# Patient Record
Sex: Female | Born: 1990 | Race: White | Hispanic: No | Marital: Single | Smoking: Never smoker
Health system: Southern US, Community
[De-identification: ages and names within clinical notes are randomized; demographics above are authoritative.]

## PROBLEM LIST (undated history)

## (undated) DIAGNOSIS — R51 Headache: Secondary | ICD-10-CM

## (undated) HISTORY — PX: TONSILLECTOMY: SUR1361

---
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
## 2012-05-18 ENCOUNTER — Encounter (HOSPITAL_COMMUNITY): Payer: Self-pay | Admitting: *Deleted

## 2012-05-18 ENCOUNTER — Emergency Department (HOSPITAL_COMMUNITY): Payer: 59

## 2012-05-18 ENCOUNTER — Emergency Department (HOSPITAL_COMMUNITY)
Admission: EM | Admit: 2012-05-18 | Discharge: 2012-05-18 | Disposition: A | Discharge status: Home / Self Care | Payer: 59 | Attending: Emergency Medicine | Admitting: Emergency Medicine

## 2012-05-18 DIAGNOSIS — R109 Unspecified abdominal pain: Secondary | ICD-10-CM

## 2012-05-18 DIAGNOSIS — Z79899 Other long term (current) drug therapy: Secondary | ICD-10-CM | POA: Insufficient documentation

## 2012-05-18 DIAGNOSIS — R1011 Right upper quadrant pain: Secondary | ICD-10-CM | POA: Insufficient documentation

## 2012-05-18 DIAGNOSIS — R1013 Epigastric pain: Secondary | ICD-10-CM | POA: Insufficient documentation

## 2012-05-18 LAB — URINALYSIS, MICROSCOPIC ONLY
Hgb urine dipstick: NEGATIVE
Nitrite: NEGATIVE
Specific Gravity, Urine: 1.022 (ref 1.005–1.030)
Urobilinogen, UA: 0.2 mg/dL (ref 0.0–1.0)

## 2012-05-18 LAB — CBC WITH DIFFERENTIAL/PLATELET
Basophils Absolute: 0.1 10*3/uL (ref 0.0–0.1)
Basophils Relative: 1 % (ref 0–1)
Eosinophils Absolute: 0.3 10*3/uL (ref 0.0–0.7)
Eosinophils Relative: 5 % (ref 0–5)
HCT: 35.5 % — ABNORMAL LOW (ref 36.0–46.0)
MCH: 24.7 pg — ABNORMAL LOW (ref 26.0–34.0)
MCHC: 31.5 g/dL (ref 30.0–36.0)
MCV: 78.4 fL (ref 78.0–100.0)
Monocytes Absolute: 0.3 10*3/uL (ref 0.1–1.0)
RDW: 15.4 % (ref 11.5–15.5)

## 2012-05-18 LAB — COMPREHENSIVE METABOLIC PANEL
AST: 16 U/L (ref 0–37)
Albumin: 3.4 g/dL — ABNORMAL LOW (ref 3.5–5.2)
Calcium: 8.8 mg/dL (ref 8.4–10.5)
Creatinine, Ser: 0.68 mg/dL (ref 0.50–1.10)

## 2012-05-18 LAB — POCT PREGNANCY, URINE: Preg Test, Ur: NEGATIVE

## 2012-05-18 LAB — LIPASE, BLOOD: Lipase: 33 U/L (ref 11–59)

## 2012-05-18 MED ORDER — OXYCODONE-ACETAMINOPHEN 5-325 MG PO TABS: 2.0000 | ORAL_TABLET | ORAL | Status: DC | PRN

## 2012-05-18 MED ORDER — FENTANYL CITRATE 0.05 MG/ML IJ SOLN
100.0000 ug | Freq: Once | INTRAMUSCULAR | Status: AC
  Administered 2012-05-18: 100 ug via INTRAVENOUS
  Filled 2012-05-18: qty 2

## 2012-05-18 MED ORDER — HYDROMORPHONE HCL PF 1 MG/ML IJ SOLN
1.0000 mg | Freq: Once | INTRAMUSCULAR | Status: AC
  Administered 2012-05-18: 1 mg via INTRAVENOUS
  Filled 2012-05-18: qty 1

## 2012-05-18 MED ORDER — METOCLOPRAMIDE HCL 10 MG PO TABS: 10.0000 mg | ORAL_TABLET | Freq: Four times a day (QID) | ORAL | Status: DC | PRN

## 2012-05-18 MED ORDER — SODIUM CHLORIDE 0.9 % IV BOLUS (SEPSIS)
1000.0000 mL | Freq: Once | INTRAVENOUS | Status: AC
  Administered 2012-05-18: 1000 mL via INTRAVENOUS

## 2012-05-18 MED ORDER — ONDANSETRON 8 MG PO TBDP: ORAL_TABLET | ORAL | Status: DC

## 2012-05-18 MED ORDER — FAMOTIDINE IN NACL 20-0.9 MG/50ML-% IV SOLN
20.0000 mg | Freq: Once | INTRAVENOUS | Status: AC
  Administered 2012-05-18: 20 mg via INTRAVENOUS
  Filled 2012-05-18: qty 50

## 2012-05-18 MED ORDER — ONDANSETRON HCL 4 MG/2ML IJ SOLN
4.0000 mg | Freq: Once | INTRAMUSCULAR | Status: AC
  Administered 2012-05-18: 4 mg via INTRAVENOUS
  Filled 2012-05-18: qty 2

## 2012-05-18 MED ORDER — SODIUM CHLORIDE 0.9 % IV SOLN: INTRAVENOUS | Status: DC

## 2012-05-18 NOTE — ED Notes (Addendum)
Pt reports upper abdominal pain that started last night, pain now radiating to back. 10/10 pain. Denies n/v/d. Last bowel movement yesterday. Denies trauma or dysuria.

## 2012-05-18 NOTE — ED Notes (Signed)
Discharge instructions reviewed w/ pt., verbalizes understanding. Three prescriptions provided at discharge. 

## 2012-05-18 NOTE — ED Provider Notes (Addendum)
History     CSN: [id number]  Arrival date & time 05/18/12  1013   First MD Initiated Contact with Patient 05/18/12 1024      Chief Complaint  Patient presents with  . Abdominal Pain    (Consider location/radiation/quality/duration/timing/severity/associated sxs/prior treatment) HPI This 21 year old female is gradual onset overnight of constant epigastric and right upper quadrant abdominal pain with nausea with one episode of vomiting on arrival to the ED. She is no diarrhea, dysuria, vaginal bleeding, or vaginal discharge. There is no treatment prior to arrival. Her constant pain is moderately severe. She is no chest pain cough or shortness of breath. She is no back pain. She is no trauma. She has not had pain like this before. History reviewed. No pertinent past medical history.  History reviewed. No pertinent past surgical history.  Family History  Problem Relation Age of Onset  . Cancer Maternal Aunt     breast  . Cancer Maternal Grandmother     breast    History  Substance Use Topics  . Smoking status: Never Smoker   . Smokeless tobacco: Never Used  . Alcohol Use: No    OB History    Grav Para Term Preterm Abortions TAB SAB Ect Mult Living                  Review of Systems 10 Systems reviewed and are negative for acute change except as noted in the HPI. Allergies  Review of patient's allergies indicates no known allergies.  Home Medications   Current Outpatient Rx  Name Route Sig Dispense Refill  . NORETHIN-ETH ESTRADIOL-FE 0.8-25 MG-MCG PO CHEW Oral Chew 1 tablet by mouth daily.    Marland Kitchen METOCLOPRAMIDE HCL 10 MG PO TABS Oral Take 1 tablet (10 mg total) by mouth every 6 (six) hours as needed (nausea/headache). 6 tablet 0  . ONDANSETRON 8 MG PO TBDP  8mg  ODT q4 hours prn nausea 4 tablet 0  . OXYCODONE-ACETAMINOPHEN 5-325 MG PO TABS Oral Take 2 tablets by mouth every 4 (four) hours as needed for pain. 10 tablet 0    BP 98/50  Pulse 74  Temp 97.7 F (36.5 C)  (Oral)  Resp 14  SpO2 100%  Physical Exam  Nursing note and vitals reviewed. Constitutional:       Awake, alert, nontoxic appearance.  HENT:  Head: Atraumatic.  Eyes: Right eye exhibits no discharge. Left eye exhibits no discharge.  Neck: Neck supple.  Cardiovascular: Normal rate and regular rhythm.   No murmur heard. Pulmonary/Chest: Effort normal and breath sounds normal. No respiratory distress. She has no wheezes. She has no rales. She exhibits no tenderness.  Abdominal: Soft. Bowel sounds are normal. She exhibits no distension and no mass. There is tenderness. There is guarding. There is no rebound.       She is moderate epigastric and right upper quadrant tenderness with guarding without rebound or tenderness the remainder of her abdomen. No back tenderness and no CVA tenderness.  Musculoskeletal: She exhibits no tenderness.       Baseline ROM, no obvious new focal weakness.  Neurological: She is alert.       Mental status and motor strength appears baseline for patient and situation.  Skin: No rash noted.  Psychiatric: She has a normal mood and affect.    ED Course  Procedures (including critical care time) Abd SNT at recheck much better, [facility] paged for f/u. Suspect contaminated U/A. D/w [facility] for f/u. Labs Reviewed  URINALYSIS, MICROSCOPIC  ONLY - Abnormal; Notable for the following:    APPearance TURBID (*)     Leukocytes, UA MODERATE (*)     Bacteria, UA MANY (*)     Squamous Epithelial / LPF MANY (*)     All other components within normal limits  CBC WITH DIFFERENTIAL - Abnormal; Notable for the following:    Hemoglobin 11.2 (*)     HCT 35.5 (*)     MCH 24.7 (*)     All other components within normal limits  COMPREHENSIVE METABOLIC PANEL - Abnormal; Notable for the following:    BUN 5 (*)     Albumin 3.4 (*)     Alkaline Phosphatase 36 (*)     Total Bilirubin 0.2 (*)     All other components within normal limits  POCT PREGNANCY, URINE  LIPASE, BLOOD  LAB REPORT  - SCANNED   No results found.   1. Abdominal pain       MDM  Pt stable in ED with no significant deterioration in condition.  Patient / Family / Caregiver informed of clinical course, understand medical decision-making process, and agree with plan.  I doubt any other EMC precluding discharge at this time including, but not necessarily limited to the following:SBI, peritonitis.        Hurman Horn, MD 05/23/12 2026  Hurman Horn, MD 05/23/12 2027

## 2012-05-20 ENCOUNTER — Encounter (HOSPITAL_COMMUNITY): Payer: Self-pay | Admitting: Pharmacy Technician

## 2012-05-20 ENCOUNTER — Encounter (INDEPENDENT_AMBULATORY_CARE_PROVIDER_SITE_OTHER): Payer: Self-pay | Admitting: General Surgery

## 2012-05-20 ENCOUNTER — Ambulatory Visit (INDEPENDENT_AMBULATORY_CARE_PROVIDER_SITE_OTHER): Payer: 59 | Admitting: General Surgery

## 2012-05-20 VITALS — BP 110/62 | HR 76 | Temp 97.2°F | Resp 16 | Ht 64.0 in | Wt 115.8 lb

## 2012-05-20 DIAGNOSIS — K8020 Calculus of gallbladder without cholecystitis without obstruction: Secondary | ICD-10-CM

## 2012-05-20 NOTE — Addendum Note (Signed)
Addended by: Frederik Schmidt on: 05/20/2012 10:18 AM   Modules accepted: Orders

## 2012-05-20 NOTE — Progress Notes (Signed)
Patient ID: Cheryl Novak, female   DOB: 1990-08-04, 21 y.o.   MRN: [id number]  Chief Complaint  Patient presents with  . Pre-op Exam    eval GB w/ sludge    HPI Cheryl Novak is a 21 y.o. female.  Symptomatic gallstones seen in the ED HPI  Very pleasant 21 y.o. Cheryl Novak from [facility], seen in the ED 2 days ago with severe abdominal pain from gallstones.  First attack.  Still having pain.  No vomiting since then.  No jaundice  History reviewed. No pertinent past medical history.  History reviewed. No pertinent past surgical history.  Family History  Problem Relation Age of Onset  . Cancer Maternal Aunt     breast  . Cancer Maternal Grandmother     breast    Social History History  Substance Use Topics  . Smoking status: Never Smoker   . Smokeless tobacco: Never Used  . Alcohol Use: No    No Known Allergies  Current Outpatient Prescriptions  Medication Sig Dispense Refill  . metoCLOPramide (REGLAN) 10 MG tablet Take 1 tablet (10 mg total) by mouth every 6 (six) hours as needed (nausea/headache).  6 tablet  0  . Norethindrone-Ethinyl Estradiol-Fe (GENERESS FE) 0.8-25 MG-MCG tablet Chew 1 tablet by mouth daily.      . ondansetron (ZOFRAN ODT) 8 MG disintegrating tablet 8mg  ODT q4 hours prn nausea  4 tablet  0  . oxyCODONE-acetaminophen (PERCOCET) 5-325 MG per tablet Take 2 tablets by mouth every 4 (four) hours as needed for pain.  10 tablet  0    Review of Systems Review of Systems  Constitutional: Negative for fever and chills.  Gastrointestinal: Positive for nausea, vomiting and abdominal pain.  All other systems reviewed and are negative.    Blood pressure 110/62, pulse 76, temperature 97.2 F (36.2 C), temperature source Temporal, resp. rate 16, height 5\' 4"  (1.626 m), weight 115 lb 12.8 oz (52.527 kg), last menstrual period 04/20/2012.  Physical Exam Physical Exam  Constitutional: She is oriented to person, place, and time. She appears well-developed and  well-nourished.  HENT:  Head: Normocephalic and atraumatic.  Eyes: Conjunctivae normal and EOM are normal. Pupils are equal, round, and reactive to light.  Neck: Normal range of motion. Neck supple.  Cardiovascular: Normal rate, regular rhythm and normal heart sounds.   Pulmonary/Chest: Effort normal and breath sounds normal.  Abdominal: Soft. Bowel sounds are normal. She exhibits no mass. There is tenderness in the right upper quadrant and epigastric area.    Musculoskeletal: Normal range of motion.  Neurological: She is alert and oriented to person, place, and time.  Skin: Skin is warm and dry.  Psychiatric: She has a normal mood and affect.    Data Reviewed Studies form [facility] visit  Assessment    Symptomatic cholelithiasis, biliary colic    Plan    The patient definitely has gallstone related symptoms. She is from [facility], [place] and may want to have the surgery there closer to the time that she has her winter break. Her athletic trainer was with her at this visit. Restrictions are no lifting for 4 weeks after surgery.  Patient can have surgery here and recover back in [facility], or she could have surgery by surgeon in [facility].         Cheryl Novak 05/20/2012, 9:50 AM

## 2012-05-21 ENCOUNTER — Telehealth (INDEPENDENT_AMBULATORY_CARE_PROVIDER_SITE_OTHER): Payer: Self-pay

## 2012-05-21 NOTE — Telephone Encounter (Signed)
Spoke to patient and gave her her appt with Dr Lindie Spruce 06/13/12 @ 2:20.

## 2012-05-28 ENCOUNTER — Encounter (HOSPITAL_COMMUNITY): Payer: Self-pay

## 2012-05-28 ENCOUNTER — Encounter (HOSPITAL_COMMUNITY)
Admission: RE | Admit: 2012-05-28 | Discharge: 2012-05-28 | Disposition: A | Discharge status: Home / Self Care | Payer: 59 | Source: Ambulatory Visit | Attending: General Surgery | Admitting: General Surgery

## 2012-05-28 HISTORY — DX: Headache: R51

## 2012-05-28 LAB — CBC
HCT: 34.2 % — ABNORMAL LOW (ref 36.0–46.0)
RBC: 4.3 MIL/uL (ref 3.87–5.11)
RDW: 15.5 % (ref 11.5–15.5)
WBC: 6.4 10*3/uL (ref 4.0–10.5)

## 2012-05-28 LAB — SURGICAL PCR SCREEN: Staphylococcus aureus: POSITIVE — AB

## 2012-05-28 NOTE — Pre-Procedure Instructions (Addendum)
20 Cheryl Novak  05/28/2012   Your procedure is scheduled on: 05/30/12  Report to [facility] at 1000 AM.  Call this number if you have problems the morning of surgery: [phone]   Remember:   Do not eat food or drink:After Midnight.    Take these medicines the morning of surgery with A SIP OF WATER: reglan, zofran, pain med   Do not wear jewelry, make-up or nail polish.  Do not wear lotions, powders, or perfumes. .  Do not shave 48 hours prior to surgery. Men may shave face and neck.  Do not bring valuables to the hospital.  Contacts, dentures or bridgework may not be worn into surgery.  Leave suitcase in the car. After surgery it may be brought to your room.  For patients admitted to the hospital, checkout time is 11:00 AM the day of discharge.   Patients discharged the day of surgery will not be allowed to drive home.  Name and phone number of your driver:mom [phone] Cheryl Novak  Special Instructions: Shower using CHG 2 nights before surgery and the night before surgery.  If you shower the day of surgery use CHG.  Use special wash - you have one bottle of CHG for all showers.  You should use approximately 1/3 of the bottle for each shower.   Please read over the following fact sheets that you were given: Pain Booklet, Coughing and Deep Breathing, MRSA Information and Surgical Site Infection Prevention

## 2012-05-29 MED ORDER — DEXTROSE 5 % IV SOLN
2.0000 g | INTRAVENOUS | Status: AC
  Administered 2012-05-30: 2 g via INTRAVENOUS
  Filled 2012-05-29: qty 2

## 2012-05-30 ENCOUNTER — Ambulatory Visit (HOSPITAL_COMMUNITY)
Admission: RE | Admit: 2012-05-30 | Discharge: 2012-05-30 | Disposition: A | Discharge status: Home / Self Care | Payer: 59 | Source: Ambulatory Visit | Attending: General Surgery | Admitting: General Surgery

## 2012-05-30 ENCOUNTER — Encounter (HOSPITAL_COMMUNITY): Payer: Self-pay | Admitting: Anesthesiology

## 2012-05-30 ENCOUNTER — Ambulatory Visit (HOSPITAL_COMMUNITY): Payer: 59 | Admitting: Anesthesiology

## 2012-05-30 ENCOUNTER — Encounter (HOSPITAL_COMMUNITY)
Admission: RE | Disposition: A | Discharge status: Home / Self Care | Payer: Self-pay | Source: Ambulatory Visit | Attending: General Surgery

## 2012-05-30 DIAGNOSIS — Z01812 Encounter for preprocedural laboratory examination: Secondary | ICD-10-CM | POA: Insufficient documentation

## 2012-05-30 DIAGNOSIS — K811 Chronic cholecystitis: Secondary | ICD-10-CM

## 2012-05-30 DIAGNOSIS — K8020 Calculus of gallbladder without cholecystitis without obstruction: Secondary | ICD-10-CM | POA: Insufficient documentation

## 2012-05-30 HISTORY — PX: CHOLECYSTECTOMY: SHX55

## 2012-05-30 SURGERY — LAPAROSCOPIC CHOLECYSTECTOMY
Anesthesia: General | Site: Abdomen | Wound class: Clean Contaminated

## 2012-05-30 MED ORDER — LIDOCAINE HCL 4 % MT SOLN
OROMUCOSAL | Status: DC | PRN
  Administered 2012-05-30: 4 mL via TOPICAL

## 2012-05-30 MED ORDER — ONDANSETRON HCL 4 MG/2ML IJ SOLN
4.0000 mg | Freq: Once | INTRAMUSCULAR | Status: AC
  Administered 2012-05-30: 4 mg via INTRAVENOUS

## 2012-05-30 MED ORDER — MIDAZOLAM HCL 5 MG/5ML IJ SOLN
INTRAMUSCULAR | Status: DC | PRN
  Administered 2012-05-30: 2 mg via INTRAVENOUS

## 2012-05-30 MED ORDER — BUPIVACAINE-EPINEPHRINE 0.25% -1:200000 IJ SOLN
INTRAMUSCULAR | Status: DC | PRN
  Administered 2012-05-30: 17 mL

## 2012-05-30 MED ORDER — FENTANYL CITRATE 0.05 MG/ML IJ SOLN
INTRAMUSCULAR | Status: DC | PRN
  Administered 2012-05-30: 100 ug via INTRAVENOUS
  Administered 2012-05-30: 50 ug via INTRAVENOUS

## 2012-05-30 MED ORDER — ROCURONIUM BROMIDE 100 MG/10ML IV SOLN
INTRAVENOUS | Status: DC | PRN
  Administered 2012-05-30: 30 mg via INTRAVENOUS

## 2012-05-30 MED ORDER — HYDROMORPHONE HCL PF 1 MG/ML IJ SOLN
0.2500 mg | INTRAMUSCULAR | Status: DC | PRN
  Administered 2012-05-30 (×2): 0.25 mg via INTRAVENOUS

## 2012-05-30 MED ORDER — LIDOCAINE HCL (CARDIAC) 20 MG/ML IV SOLN
INTRAVENOUS | Status: DC | PRN
  Administered 2012-05-30: 80 mg via INTRAVENOUS

## 2012-05-30 MED ORDER — SODIUM CHLORIDE 0.9 % IR SOLN
Status: DC | PRN
  Administered 2012-05-30: 1000 mL

## 2012-05-30 MED ORDER — ONDANSETRON HCL 4 MG/2ML IJ SOLN
INTRAMUSCULAR | Status: AC
  Filled 2012-05-30: qty 2

## 2012-05-30 MED ORDER — LACTATED RINGERS IV SOLN
INTRAVENOUS | Status: DC | PRN
  Administered 2012-05-30 (×2): via INTRAVENOUS

## 2012-05-30 MED ORDER — LACTATED RINGERS IV SOLN
INTRAVENOUS | Status: DC
  Administered 2012-05-30: 12:00:00 via INTRAVENOUS

## 2012-05-30 MED ORDER — ONDANSETRON HCL 4 MG/2ML IJ SOLN
INTRAMUSCULAR | Status: DC | PRN
  Administered 2012-05-30: 4 mg via INTRAVENOUS

## 2012-05-30 MED ORDER — PROPOFOL 10 MG/ML IV BOLUS
INTRAVENOUS | Status: DC | PRN
  Administered 2012-05-30: 160 mg via INTRAVENOUS

## 2012-05-30 MED ORDER — HYDROMORPHONE HCL PF 1 MG/ML IJ SOLN
INTRAMUSCULAR | Status: AC
  Filled 2012-05-30: qty 1

## 2012-05-30 MED ORDER — DEXAMETHASONE SODIUM PHOSPHATE 4 MG/ML IJ SOLN
INTRAMUSCULAR | Status: DC | PRN
  Administered 2012-05-30: 8 mg via INTRAVENOUS

## 2012-05-30 MED ORDER — GLYCOPYRROLATE 0.2 MG/ML IJ SOLN
INTRAMUSCULAR | Status: DC | PRN
  Administered 2012-05-30: 0.6 mg via INTRAVENOUS

## 2012-05-30 MED ORDER — OXYCODONE-ACETAMINOPHEN 5-325 MG PO TABS: 1.0000 | ORAL_TABLET | ORAL | Status: DC | PRN

## 2012-05-30 MED ORDER — NEOSTIGMINE METHYLSULFATE 1 MG/ML IJ SOLN
INTRAMUSCULAR | Status: DC | PRN
  Administered 2012-05-30: 4 mg via INTRAVENOUS

## 2012-05-30 MED ORDER — PHENYLEPHRINE HCL 10 MG/ML IJ SOLN
INTRAMUSCULAR | Status: DC | PRN
  Administered 2012-05-30: 80 ug via INTRAVENOUS
  Administered 2012-05-30: 40 ug via INTRAVENOUS

## 2012-05-30 MED ORDER — 0.9 % SODIUM CHLORIDE (POUR BTL) OPTIME
TOPICAL | Status: DC | PRN
  Administered 2012-05-30: 1000 mL

## 2012-05-30 MED ORDER — BUPIVACAINE-EPINEPHRINE PF 0.25-1:200000 % IJ SOLN
INTRAMUSCULAR | Status: AC
  Filled 2012-05-30: qty 30

## 2012-05-30 SURGICAL SUPPLY — 45 items
APPLIER CLIP 5 13 M/L LIGAMAX5 (MISCELLANEOUS) ×3
APPLIER CLIP ROT 10 11.4 M/L (STAPLE)
BLADE SURG ROTATE 9660 (MISCELLANEOUS) IMPLANT
CANISTER SUCTION 2500CC (MISCELLANEOUS) ×3 IMPLANT
CHLORAPREP W/TINT 26ML (MISCELLANEOUS) ×3 IMPLANT
CLIP APPLIE 5 13 M/L LIGAMAX5 (MISCELLANEOUS) ×2 IMPLANT
CLIP APPLIE ROT 10 11.4 M/L (STAPLE) IMPLANT
CLOTH BEACON ORANGE TIMEOUT ST (SAFETY) ×3 IMPLANT
COVER MAYO STAND STRL (DRAPES) IMPLANT
COVER SURGICAL LIGHT HANDLE (MISCELLANEOUS) ×3 IMPLANT
DECANTER SPIKE VIAL GLASS SM (MISCELLANEOUS) ×3 IMPLANT
DERMABOND ADVANCED (GAUZE/BANDAGES/DRESSINGS) ×1
DERMABOND ADVANCED .7 DNX12 (GAUZE/BANDAGES/DRESSINGS) ×2 IMPLANT
DRAPE C-ARM 42X72 X-RAY (DRAPES) IMPLANT
DRAPE UTILITY 15X26 W/TAPE STR (DRAPE) ×6 IMPLANT
ELECT REM PT RETURN 9FT ADLT (ELECTROSURGICAL) ×3
ELECTRODE REM PT RTRN 9FT ADLT (ELECTROSURGICAL) ×2 IMPLANT
GLOVE BIO SURGEON STRL SZ7 (GLOVE) ×3 IMPLANT
GLOVE BIOGEL PI IND STRL 7.0 (GLOVE) ×2 IMPLANT
GLOVE BIOGEL PI IND STRL 7.5 (GLOVE) ×2 IMPLANT
GLOVE BIOGEL PI IND STRL 8 (GLOVE) ×2 IMPLANT
GLOVE BIOGEL PI INDICATOR 7.0 (GLOVE) ×1
GLOVE BIOGEL PI INDICATOR 7.5 (GLOVE) ×1
GLOVE BIOGEL PI INDICATOR 8 (GLOVE) ×1
GLOVE ECLIPSE 7.5 STRL STRAW (GLOVE) ×6 IMPLANT
GLOVE SURG SS PI 7.0 STRL IVOR (GLOVE) ×3 IMPLANT
GOWN STRL NON-REIN LRG LVL3 (GOWN DISPOSABLE) ×9 IMPLANT
KIT BASIN OR (CUSTOM PROCEDURE TRAY) ×3 IMPLANT
KIT ROOM TURNOVER OR (KITS) ×3 IMPLANT
NS IRRIG 1000ML POUR BTL (IV SOLUTION) ×3 IMPLANT
PAD ARMBOARD 7.5X6 YLW CONV (MISCELLANEOUS) ×3 IMPLANT
POUCH SPECIMEN RETRIEVAL 10MM (ENDOMECHANICALS) ×3 IMPLANT
SCISSORS LAP 5X35 DISP (ENDOMECHANICALS) IMPLANT
SET CHOLANGIOGRAPH 5 50 .035 (SET/KITS/TRAYS/PACK) IMPLANT
SET IRRIG TUBING LAPAROSCOPIC (IRRIGATION / IRRIGATOR) ×3 IMPLANT
SLEEVE ENDOPATH XCEL 5M (ENDOMECHANICALS) ×6 IMPLANT
SPECIMEN JAR SMALL (MISCELLANEOUS) ×3 IMPLANT
SUT MNCRL AB 4-0 PS2 18 (SUTURE) ×3 IMPLANT
TOWEL OR 17X24 6PK STRL BLUE (TOWEL DISPOSABLE) ×3 IMPLANT
TOWEL OR 17X26 10 PK STRL BLUE (TOWEL DISPOSABLE) ×3 IMPLANT
TRAY LAPAROSCOPIC (CUSTOM PROCEDURE TRAY) ×3 IMPLANT
TROCAR XCEL BLUNT TIP 100MML (ENDOMECHANICALS) ×3 IMPLANT
TROCAR XCEL NON-BLD 11X100MML (ENDOMECHANICALS) IMPLANT
TROCAR XCEL NON-BLD 5MMX100MML (ENDOMECHANICALS) ×3 IMPLANT
WATER STERILE IRR 1000ML POUR (IV SOLUTION) IMPLANT

## 2012-05-30 NOTE — Anesthesia Preprocedure Evaluation (Addendum)
Anesthesia Evaluation  Patient identified by MRN, date of birth, ID band Patient awake    Reviewed: Allergy & Precautions, H&P , NPO status , Patient's Chart, lab work & pertinent test results, reviewed documented beta blocker date and time   History of Anesthesia Complications Negative for: history of anesthetic complications  Airway Mallampati: II TM Distance: >3 FB Neck ROM: Full    Dental  (+) Teeth Intact and Dental Advisory Given   Pulmonary neg pulmonary ROS,  breath sounds clear to auscultation        Cardiovascular negative cardio ROS  Rhythm:Regular Rate:Normal     Neuro/Psych  Headaches, negative psych ROS   GI/Hepatic negative GI ROS, Neg liver ROS,   Endo/Other  negative endocrine ROS  Renal/GU negative Renal ROS  negative genitourinary   Musculoskeletal negative musculoskeletal ROS (+)   Abdominal   Peds negative pediatric ROS (+)  Hematology negative hematology ROS (+)   Anesthesia Other Findings   Reproductive/Obstetrics negative OB ROS                         Anesthesia Physical Anesthesia Plan  ASA: II  Anesthesia Plan: General   Post-op Pain Management:    Induction: Intravenous  Airway Management Planned: Oral ETT  Additional Equipment:   Intra-op Plan:   Post-operative Plan: Extubation in OR  Informed Consent:   Plan Discussed with: CRNA, Anesthesiologist and Surgeon  Anesthesia Plan Comments:         Anesthesia Quick Evaluation

## 2012-05-30 NOTE — Transfer of Care (Signed)
Immediate Anesthesia Transfer of Care Note  Patient: Cheryl Novak  Procedure(s) Performed: Procedure(s) (LRB) with comments: LAPAROSCOPIC CHOLECYSTECTOMY (N/A)  Patient Location: PACU  Anesthesia Type:General  Level of Consciousness: awake, alert  and oriented  Airway & Oxygen Therapy: Patient Spontanous Breathing  Post-op Assessment: Report given to PACU RN and Post -op Vital signs reviewed and stable  Post vital signs: Reviewed and stable  Complications: No apparent anesthesia complications

## 2012-05-30 NOTE — Anesthesia Postprocedure Evaluation (Signed)
  Anesthesia Post-op Note  Patient: Cheryl Novak  Procedure(s) Performed: Procedure(s) (LRB) with comments: LAPAROSCOPIC CHOLECYSTECTOMY (N/A)  Patient Location: PACU  Anesthesia Type:General  Level of Consciousness: awake  Airway and Oxygen Therapy: Patient Spontanous Breathing  Post-op Pain: mild  Post-op Assessment: Post-op Vital signs reviewed  Post-op Vital Signs: Reviewed  Complications: No apparent anesthesia complications

## 2012-05-30 NOTE — Progress Notes (Signed)
NO COMPLAINTS OF NAUSEA OR PAIN AND VOIDED

## 2012-05-30 NOTE — Op Note (Signed)
OPERATIVE REPORT  DATE OF OPERATION: 05/30/2012  PATIENT:  Cheryl Novak  21 y.o. female  PRE-OPERATIVE DIAGNOSIS:  SYMPTOMATIC CHOLELITHIASIS  POST-OPERATIVE DIAGNOSIS:  SYMPTOMATIC CHOLELITHIASIS  PROCEDURE:  Procedure(s): LAPAROSCOPIC CHOLECYSTECTOMY  SURGEON:  Surgeon(s): Cherylynn Ridges, MD  ASSISTANT: None  ANESTHESIA:   general  EBL: <20 ml  BLOOD ADMINISTERED: none  DRAINS: none   SPECIMEN:  Source of Specimen:  Gallbladder and sludge  COUNTS CORRECT:  YES  PROCEDURE DETAILS: The patient was taken to the operating room and placed on the table in the supine position.  After an adequate endotracheal anesthetic was administered, (she/he) was prepped with ChloroPrep, and then draped in the usual manner exposing the entire abdomen laterally, inferiorly and up  to the costal margins.  After a proper timeout was performed including identifying the patient and the procedure to be performed, an infra-umbilical1.5cm midline incision was made using a #15 blade.  This was taken down to the fascia which was then incised with a 15 blade.  The edges of the fascia were tented up with Kocher clamps as the preperitoneal space was penetrated with a Kelly clamp into the peritoneum.  Once this was done, a pursestring suture of 0 Vicryl was passed around the fascial opening.  This was subsequently used to secure the [facility] cannula which was passed into the peritoneal cavity.  Once the [facility] cannula was in place, carbon dioxide gas was insufflated into the peritoneal cavity up to a maximal intra-abdominal pressure of 15mm Hg.The laparoscope, with attached camera and light source, was passed into the peritoneal cavity to visualize the direct insertion of two right upper quadrant 5mm cannulas, and a sub-xiphoid 5mm cannula.  Once all cannulas were in place, the dissection was begun.  Two ratcheted graspers were attached to the dome and infundibulum of the gallbladder and retracted towards the  anterior abdominal wall and the right upper quadrant.  Using cautery attached to a dissecting forceps, the peritoneum overlaying the triangle of Chalot and the hepatoduodenal triangle was dissected away exposing the cystic duct and the cystic artery.  A clip was placed on the gallbladder side of the cystic duct, then two clips were placed distally.  No cholangiogram was performed because the anatomy was clear and the patient had normal preoperative LFT's.  The gallbladder was then dissected out of the hepatic bed without event.  It was retrieved from the abdomen using an EndoCatch bag without event.  Once the gallbladder was removed, the bed was inspected for hemostasis.  Once excellent hemostasis was obtained all gas and fluids were aspirated from above the liver, then the cannulas were removed.  The infra-umbilical incision was closed using the pursestring suture which was in place.  0.25% bupivicaine with epinephrine was injected at all sites.  All 10mm or greater cannula sites were close using a running subcuticular stitch of 4-0 Monocryl.  5.7mm cannula sites were closed with Dermabond only.Steri-Strips and Tagaderm were used to complete the dressings at all sites.  At this point all needle, sponge, and instrument counts were correct.The patient was awakened from anesthesia and taken to the PACU in stable condition.  PATIENT DISPOSITION:  PACU - hemodynamically stable.   Cherylynn Ridges 11/1/20132:03 PM

## 2012-05-30 NOTE — Preoperative (Signed)
Beta Blockers   Reason not to administer Beta Blockers:Not Applicable 

## 2012-05-30 NOTE — Progress Notes (Signed)
Pt attempted to void but unsuccessful.  Mother at beside.

## 2012-05-31 NOTE — H&P (Signed)
Patient ID: Cheryl Novak, female DOB: August 27, 1990, 21 y.o. MRN: [id number]  Chief Complaint   Patient presents with   .  Pre-op Exam     eval GB w/ sludge    HPI  Cheryl Novak is a 21 y.o. female. Symptomatic gallstones seen in the ED  HPI  Very pleasant 21 y.o. Cheryl Novak, seen in the ED 2 days ago with severe abdominal pain from gallstones. First attack. Still having pain. No vomiting since then. No jaundice  History reviewed. No pertinent past medical history.  History reviewed. No pertinent past surgical history.  Family History   Problem  Relation  Age of Onset   .  Cancer  Maternal Aunt       breast    .  Cancer  Maternal Grandmother       breast    Social History  History   Substance Use Topics   .  Smoking status:  Never Smoker   .  Smokeless tobacco:  Never Used   .  Alcohol Use:  No    No Known Allergies  Current Outpatient Prescriptions   Medication  Sig  Dispense  Refill   .  metoCLOPramide (REGLAN) 10 MG tablet  Take 1 tablet (10 mg total) by mouth every 6 (six) hours as needed (nausea/headache).  6 tablet  0   .  Norethindrone-Ethinyl Estradiol-Fe (GENERESS FE) 0.8-25 MG-MCG tablet  Chew 1 tablet by mouth daily.     .  ondansetron (ZOFRAN ODT) 8 MG disintegrating tablet  8mg  ODT q4 hours prn nausea  4 tablet  0   .  oxyCODONE-acetaminophen (PERCOCET) 5-325 MG per tablet  Take 2 tablets by mouth every 4 (four) hours as needed for pain.  10 tablet  0    Review of Systems  Review of Systems  Constitutional: Negative for fever and chills.  Gastrointestinal: Positive for nausea, vomiting and abdominal pain.  All other systems reviewed and are negative.   Blood pressure 110/62, pulse 76, temperature 97.2 F (36.2 C), temperature source Temporal, resp. rate 16, height 5\' 4"  (1.626 m), weight 115 lb 12.8 oz (52.527 kg), last menstrual period 04/20/2012.  Physical Exam  Physical Exam  Constitutional: She is oriented to person, place, and time. She  appears well-developed and well-nourished.  HENT:  Head: Normocephalic and atraumatic.  Eyes: Conjunctivae normal and EOM are normal. Pupils are equal, round, and reactive to light.  Neck: Normal range of motion. Neck supple.  Cardiovascular: Normal rate, regular rhythm and normal heart sounds.  Pulmonary/Chest: Effort normal and breath sounds normal.  Abdominal: Soft. Bowel sounds are normal. She exhibits no mass. There is tenderness in the right upper quadrant and epigastric area.    Musculoskeletal: Normal range of motion.  Neurological: She is alert and oriented to person, place, and time.  Skin: Skin is warm and dry.  Psychiatric: She has a normal mood and affect.   Data Reviewed  Studies form [facility] visit  Assessment   Symptomatic cholelithiasis, biliary colic   Plan   The patient definitely has gallstone related symptoms.  She is from [facility], [place] and may want to have the surgery there closer to the time that she has her winter break.  Her athletic trainer was with her at this visit.  Restrictions are no lifting for 4 weeks after surgery.  Patient can have surgery here and recover back in [facility], or she could have surgery by surgeon in [facility]..  The patient has  decided to her her surgery here.  We will go ahead and schedule.  Marta Lamas. Gae Bon, MD, FACS [phone] [facility]

## 2012-06-02 ENCOUNTER — Encounter (HOSPITAL_COMMUNITY): Payer: Self-pay | Admitting: General Surgery

## 2012-06-13 ENCOUNTER — Ambulatory Visit (INDEPENDENT_AMBULATORY_CARE_PROVIDER_SITE_OTHER): Payer: 59 | Admitting: General Surgery

## 2012-06-13 ENCOUNTER — Encounter (INDEPENDENT_AMBULATORY_CARE_PROVIDER_SITE_OTHER): Payer: Self-pay | Admitting: General Surgery

## 2012-06-13 ENCOUNTER — Encounter (INDEPENDENT_AMBULATORY_CARE_PROVIDER_SITE_OTHER): Payer: 59 | Admitting: General Surgery

## 2012-06-13 VITALS — BP 120/72 | HR 74 | Temp 97.4°F | Resp 12 | Ht 64.0 in | Wt 116.8 lb

## 2012-06-13 DIAGNOSIS — Z09 Encounter for follow-up examination after completed treatment for conditions other than malignant neoplasm: Secondary | ICD-10-CM

## 2012-06-13 NOTE — Progress Notes (Signed)
Patient ID: Cheryl Novak, female   DOB: 26-Feb-1991, 21 y.o.   MRN: [id number] Patient is a 21 year old female cholelithiasis status post left upper cholecystectomy by Dr. Lindie Spruce. Patient did well postoperatively and has been tolerating diet had no problems otherwise.  On exam: We did clean dry and intact with no erythema. patient still has Steri-Strips as well as Tegaderm and placed in the original operation   Assessment and plan: This can follow up when necessary The patient okay to resume full golf practice in 1 week

## 2013-03-12 IMAGING — CT CT ANGIO CHEST
1 of 2 series · 19 of 32 positions shown · IV contrast (agent unspecified)
Comparison: None.

CLINICAL DATA: Nausea, vomiting, and diarrhea for few days.  The
patient passed out today.  White cell count 9.6.  D-dimer 0.88.

CT ANGIOGRAPHY CHEST WITH CONTRAST
TECHNIQUE: Multidetector CT imaging of the chest was performed
using the standard protocol during bolus administration of
intravenous contrast.  Multiplanar CT image reconstructions
including MIPs were obtained to evaluate the vascular anatomy.
Contrast:  80 ml [id number]

[Series 7: thins for pacs · axial · 0.57mm/px · z∈[-227,+6]mm · 19 of 259 slices shown]
[im 13/259  lung]
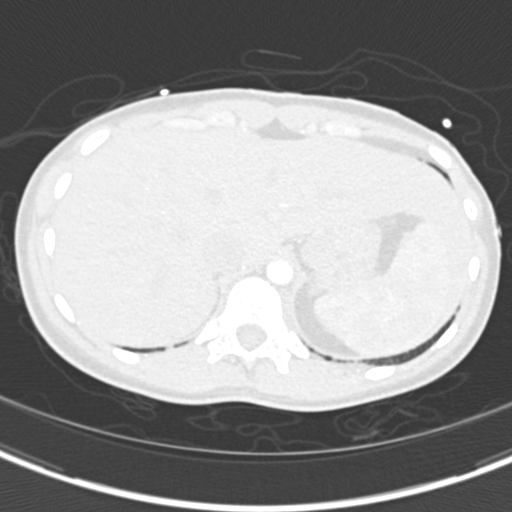
[im 26/259  mediastinal]
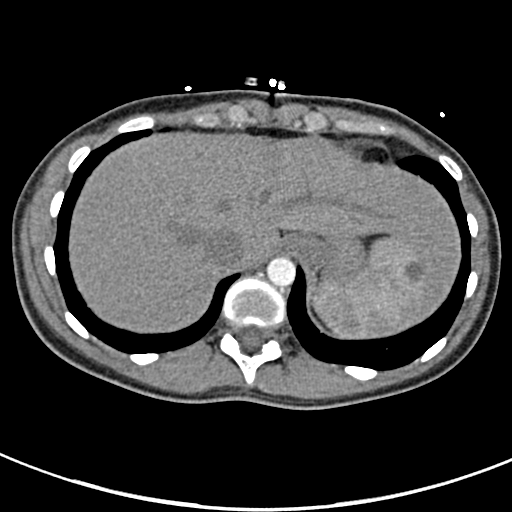
[im 39/259  lung]
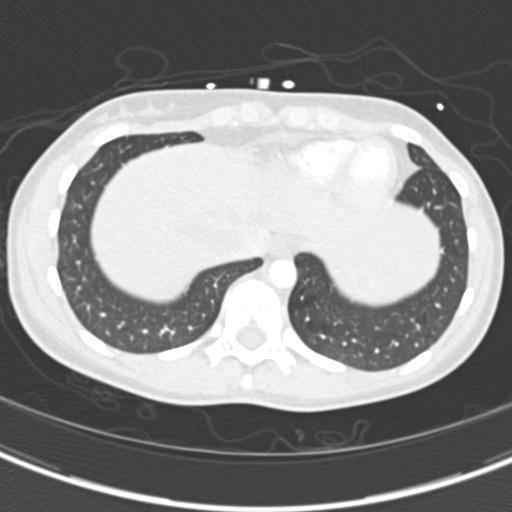
[im 65/259  mediastinal]
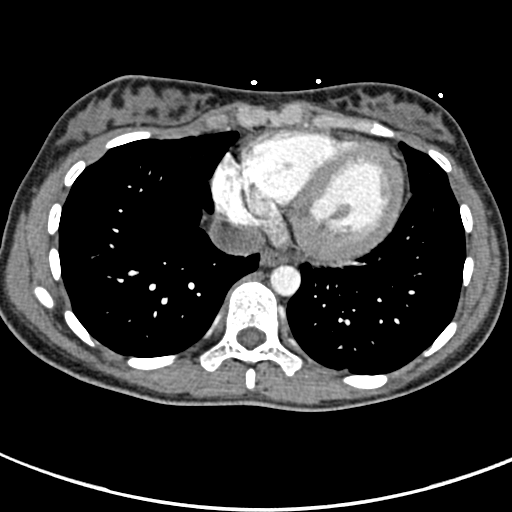
[im 78/259  lung]
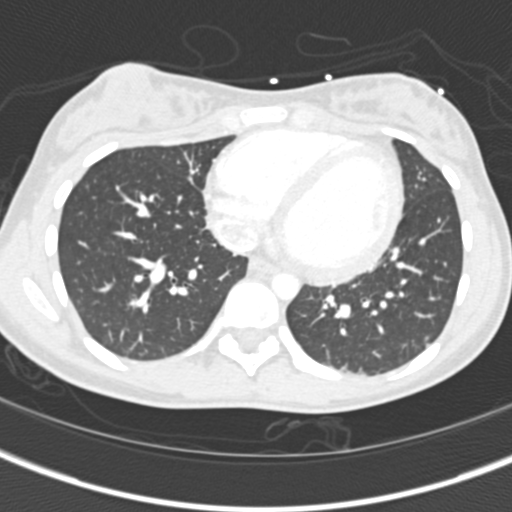
[im 87/259  mediastinal]
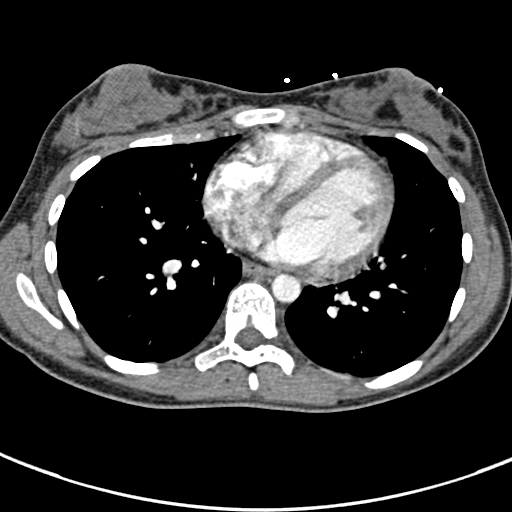
[im 91/259  lung]
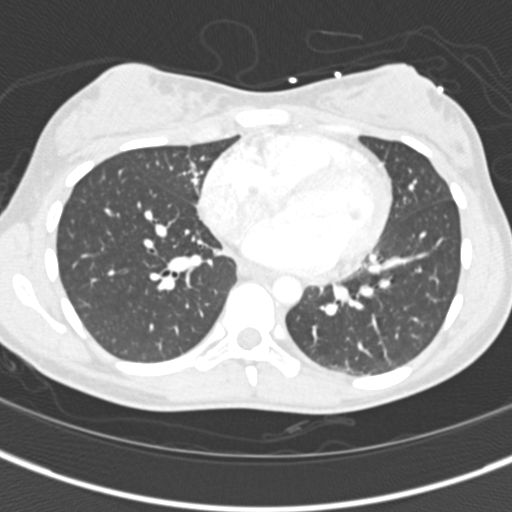
[im 104/259  mediastinal]
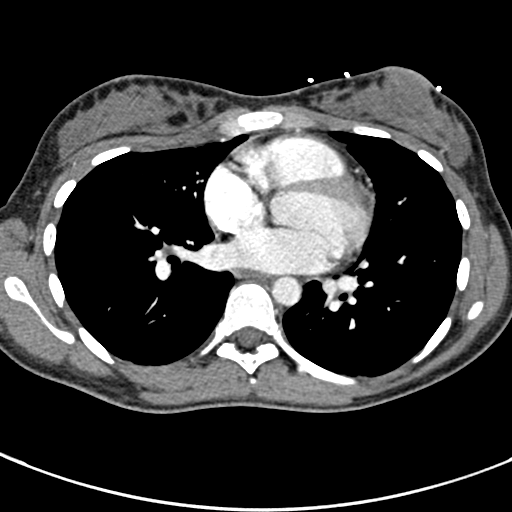
[im 117/259  lung]
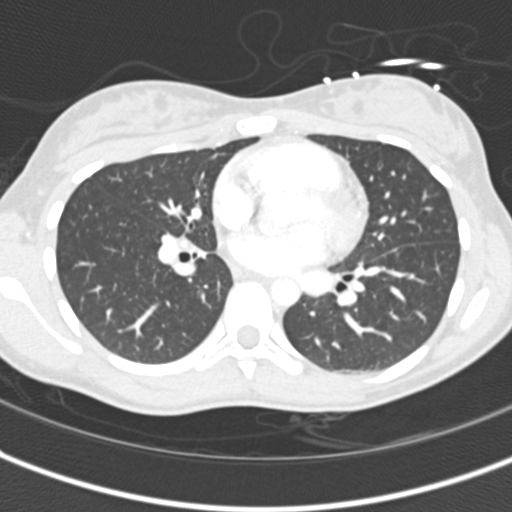
[im 130/259  mediastinal]
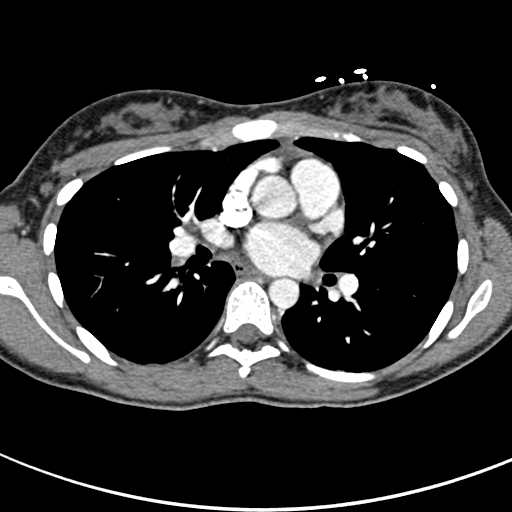
[im 142/259  lung]
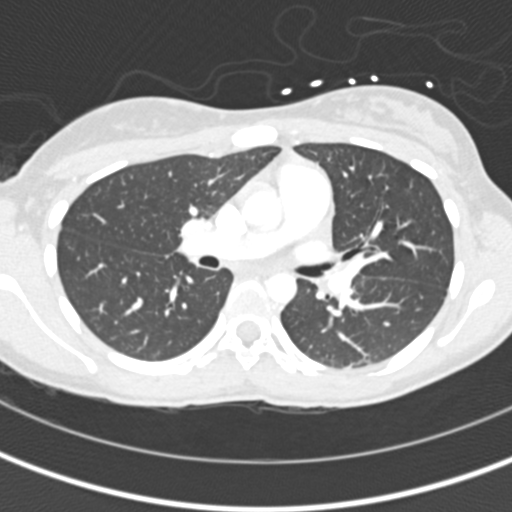
[im 155/259  mediastinal]
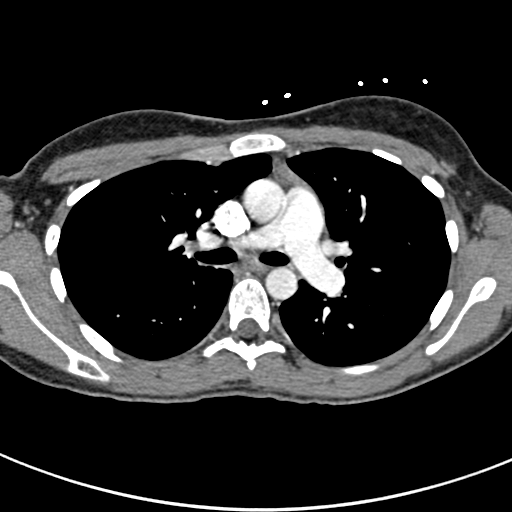
[im 168/259  lung]
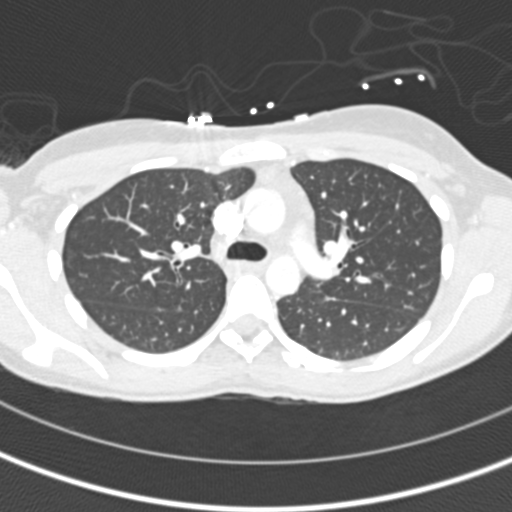
[im 173/259  mediastinal]
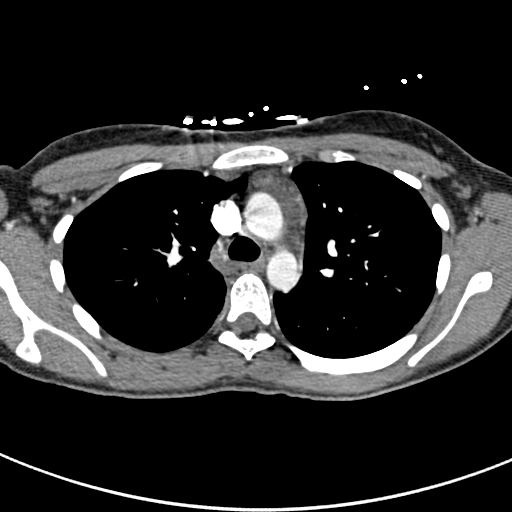
[im 181/259  lung]
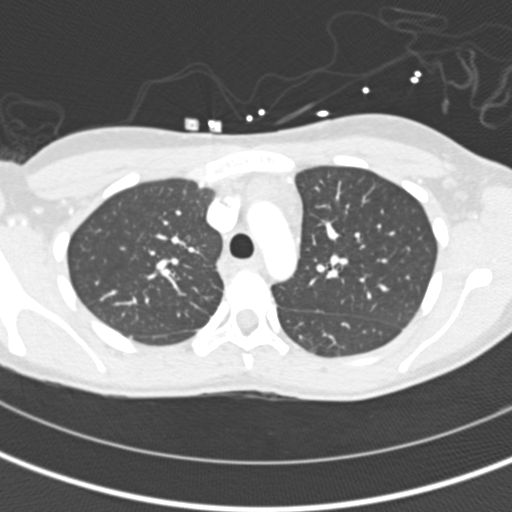
[im 194/259  mediastinal]
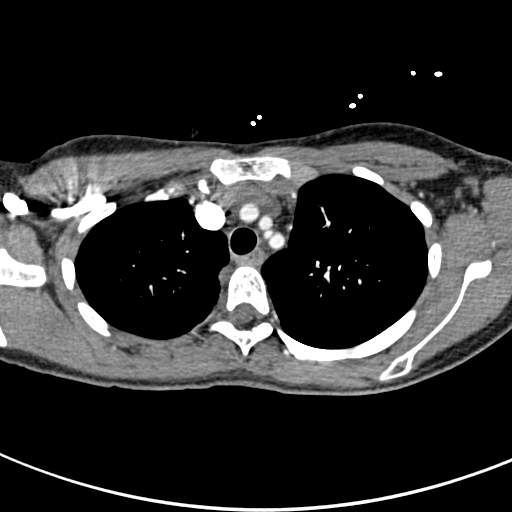
[im 220/259  lung]
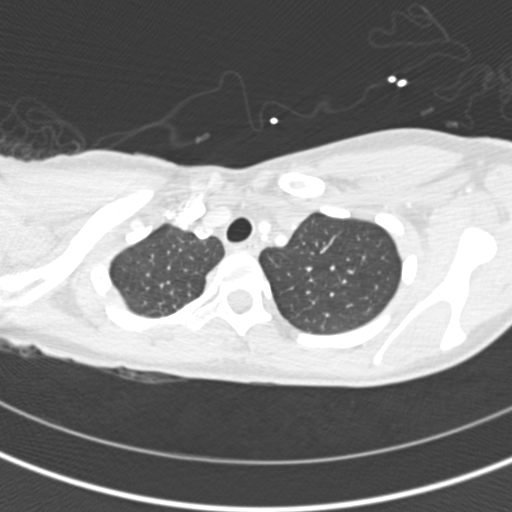
[im 233/259  mediastinal]
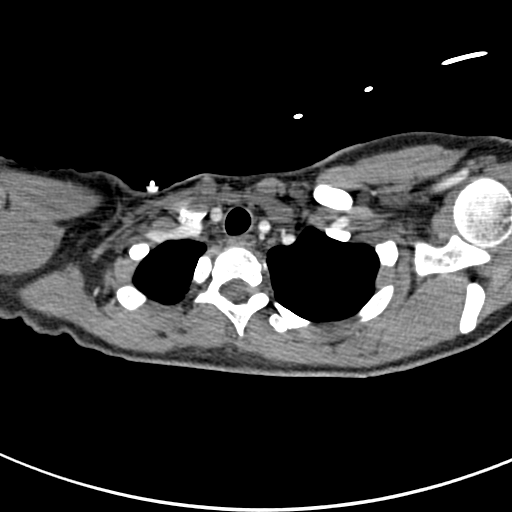
[im 246/259  lung]
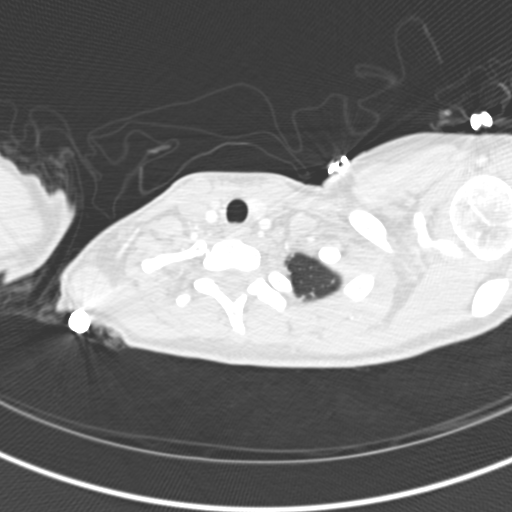

[19 of 32 positions shown; findings below may reference images not displayed]

FINDINGS: Technically adequate study with good opacification of
the central and segmental pulmonary arteries.  No focal filling
defects demonstrated.  No evidence of significant pulmonary
embolus.  Increased soft tissue density in the anterior mediastinum
consistent with residual thymic tissue.  No significant
lymphadenopathy in the chest.  Incidental note of adherent origin
of the left vertebral artery from the aortic arch.  Normal caliber
thoracic aorta.  No evidence of dissection.  No pleural effusions.
Minimal dependent changes in the lung bases.  No evidence of focal
airspace consolidation or interstitial disease.  Airways appear
patent.  11 mm low attenuation lesion in the superior spleen
suggesting a cyst.

Review of the MIP images confirms the above findings.
IMPRESSION: No evidence of significant pulmonary embolus.

## 2014-03-28 IMAGING — US US ABDOMEN COMPLETE
1 series · 14 of 25 positions shown · non-contrast
Comparison: None.

CLINICAL DATA: Upper abdominal pain

ABDOMINAL ULTRASOUND COMPLETE

[Series 1: us abdomen complete · 0.26mm/px · 14 of 127 slices shown]
[im 1/127]
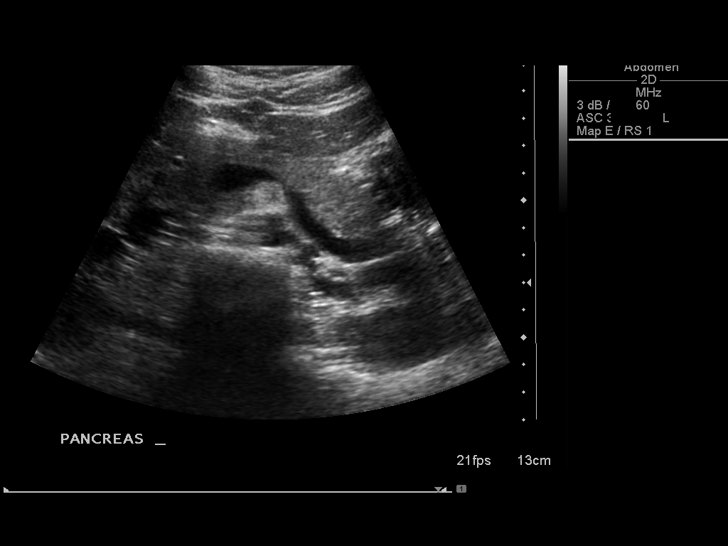
[im 11/127]
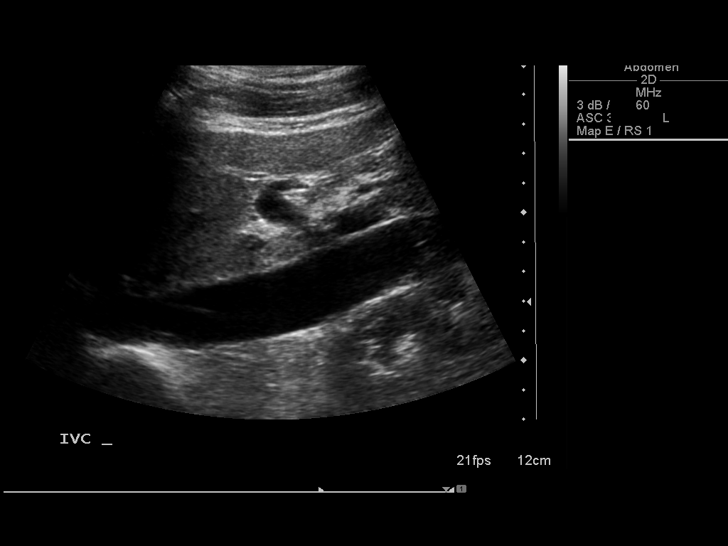
[im 22/127]
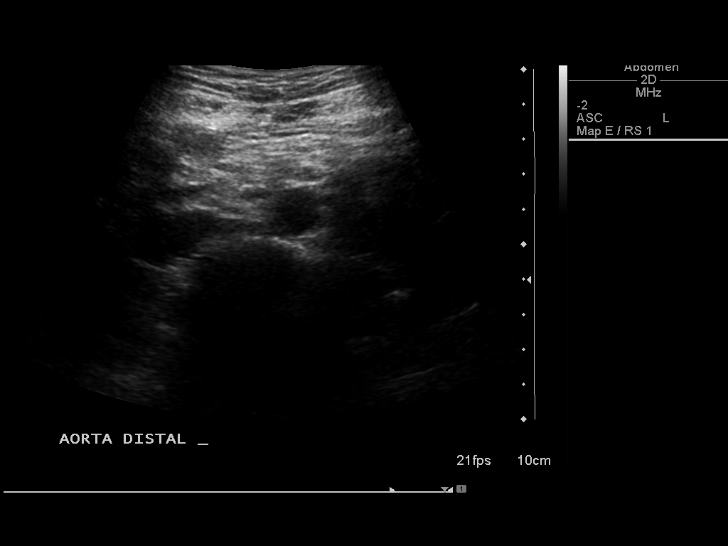
[im 32/127]
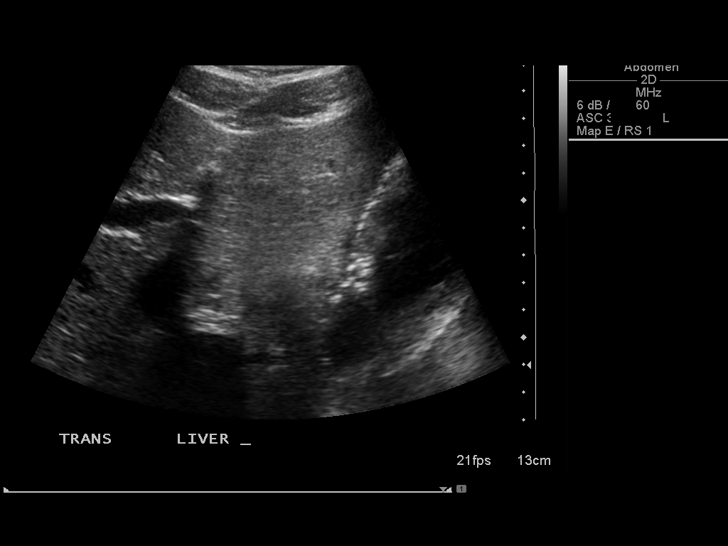
[im 43/127]
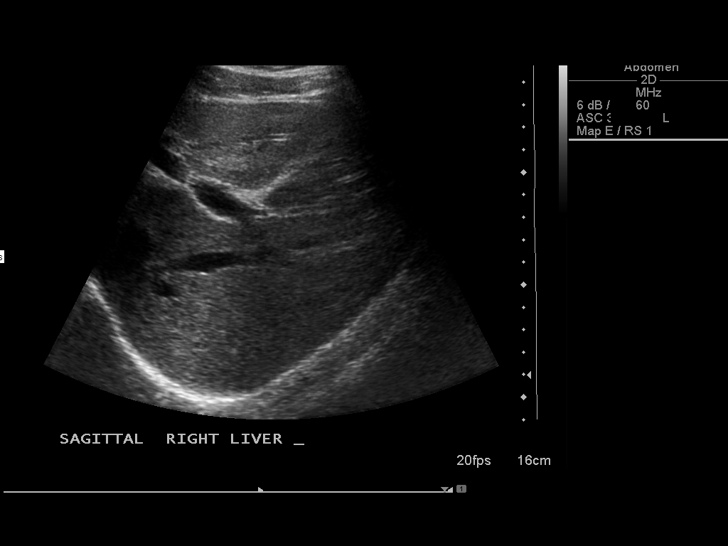
[im 48/127]
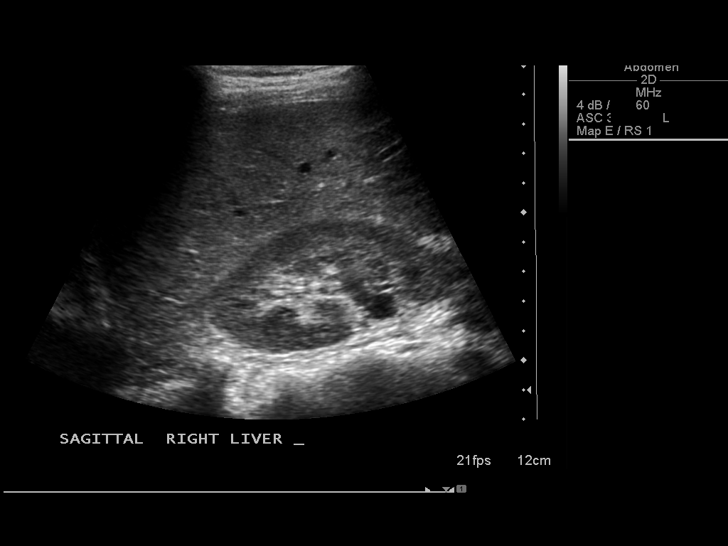
[im 58/127]
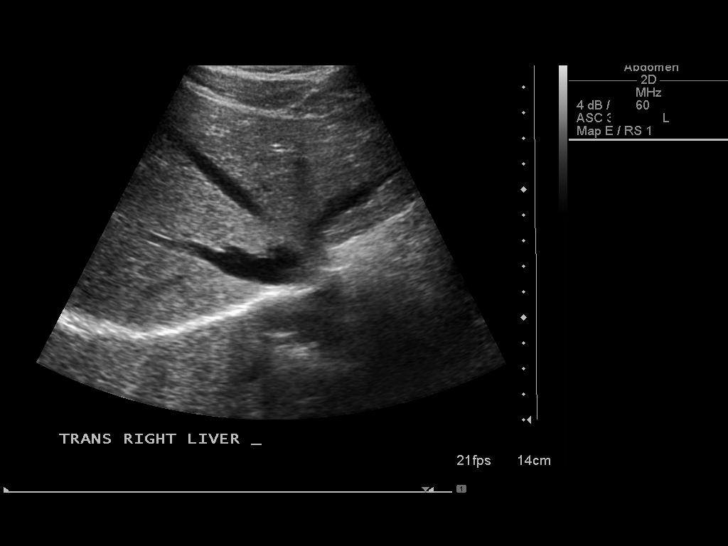
[im 69/127]
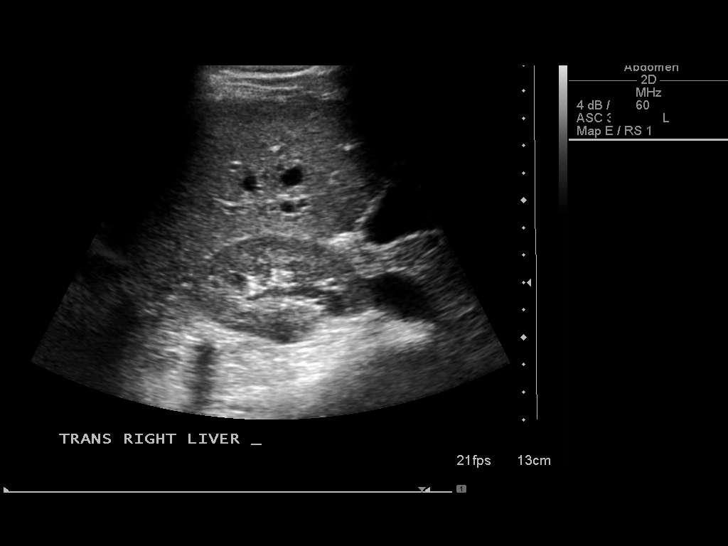
[im 79/127]
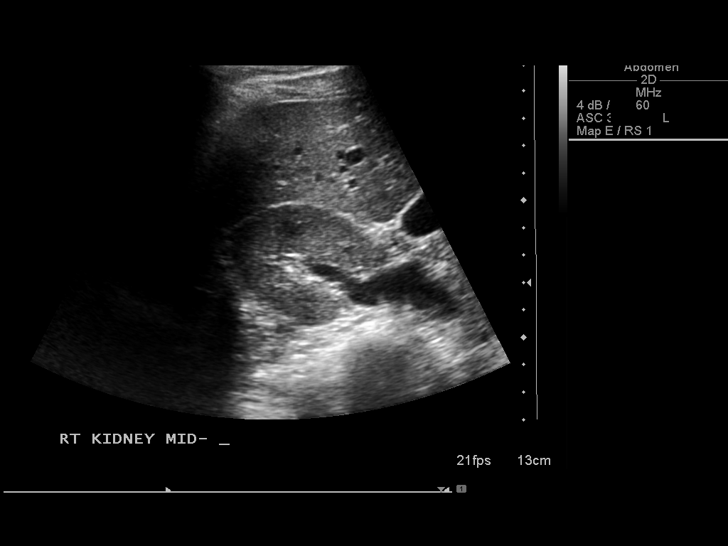
[im 85/127]
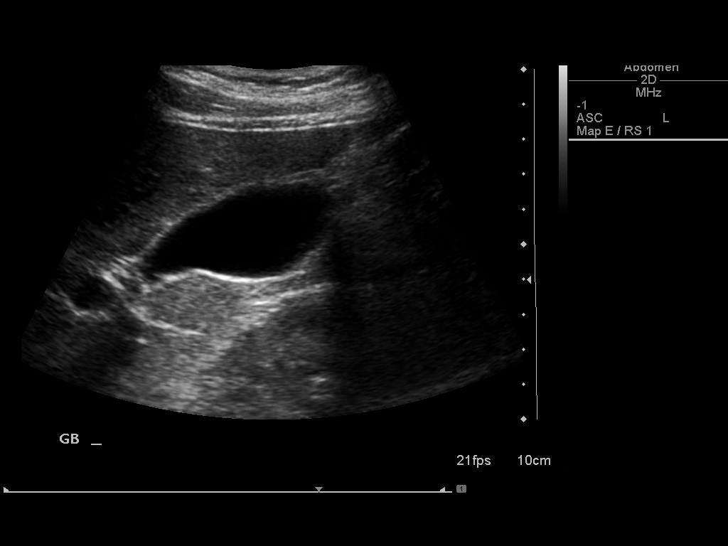
[im 95/127]
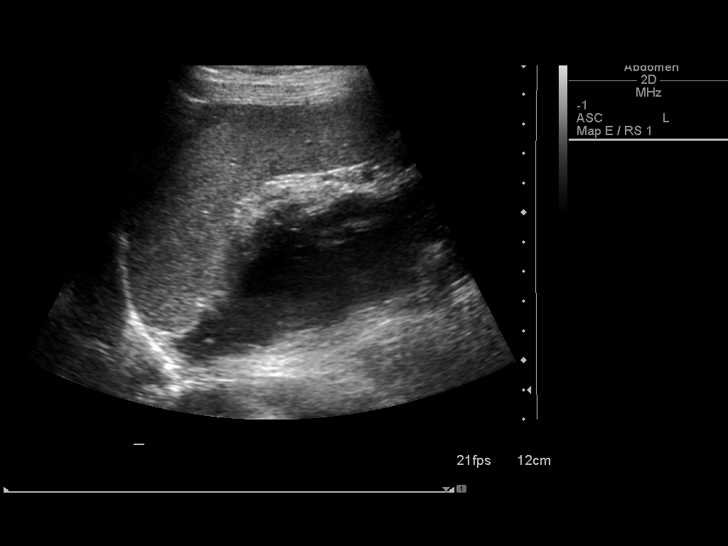
[im 106/127]
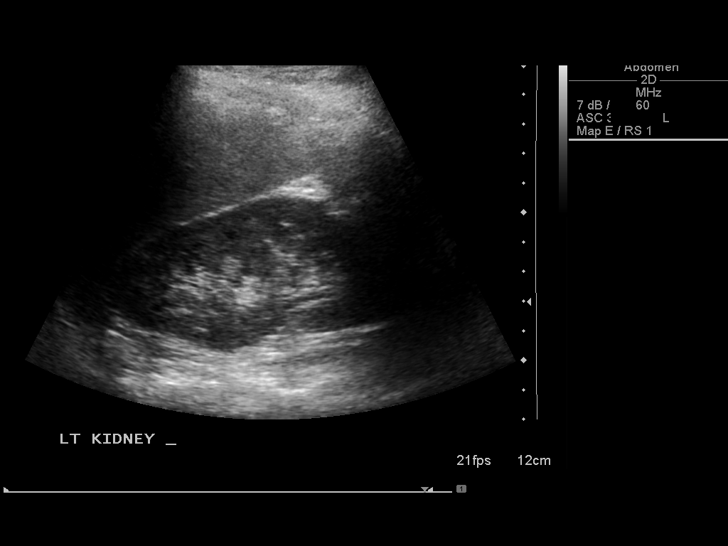
[im 116/127]
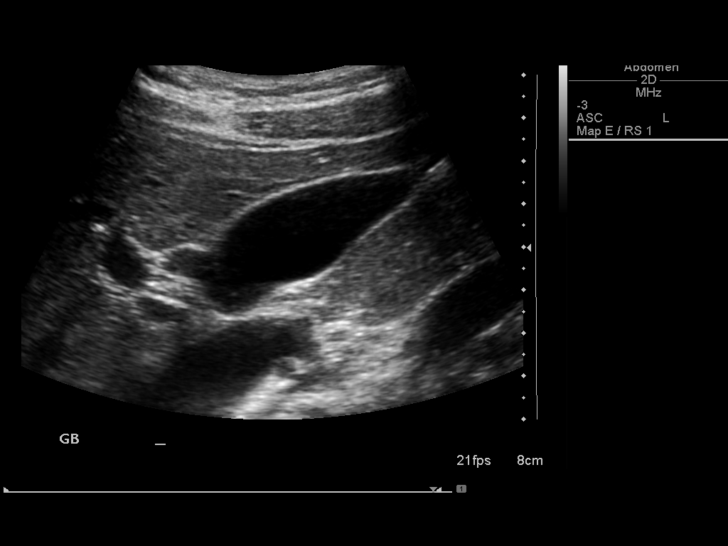
[im 127/127]
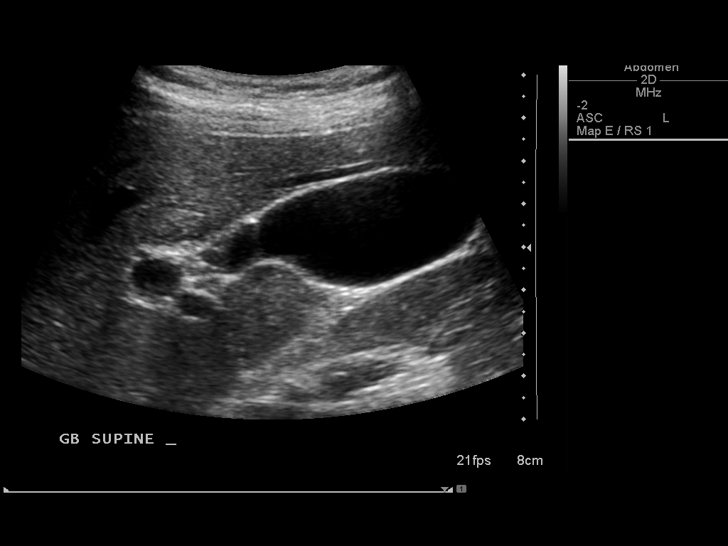

[14 of 25 positions shown; findings below may reference images not displayed]

FINDINGS: Gallbladder:  Trace amount of sludge within the gallbladder.  No
gallstones, gallbladder wall thickening, or pericholecystic fluid.

Common Bile Duct:  Within normal limits in caliber.

Liver: No focal mass lesion identified.  Within normal limits in
parenchymal echogenicity.

IVC:  Appears normal.

Pancreas:  No abnormality identified.

Spleen:  Within normal limits in size and echotexture.

Right kidney:  Normal in size and parenchymal echogenicity.  No
evidence of mass or hydronephrosis.

Left kidney:  Normal in size and parenchymal echogenicity.  No
evidence of mass or hydronephrosis.

Abdominal Aorta:  No aneurysm identified.
IMPRESSION: 1.  Gallbladder sludge.
2.  No acute findings.

## 2014-03-31 ENCOUNTER — Encounter (HOSPITAL_COMMUNITY): Payer: Self-pay | Admitting: Emergency Medicine

## 2014-03-31 ENCOUNTER — Emergency Department (HOSPITAL_COMMUNITY)
Admission: EM | Admit: 2014-03-31 | Discharge: 2014-03-31 | Disposition: A | Discharge status: Home / Self Care | Payer: 59 | Attending: Emergency Medicine | Admitting: Emergency Medicine

## 2014-03-31 DIAGNOSIS — Z3202 Encounter for pregnancy test, result negative: Secondary | ICD-10-CM | POA: Insufficient documentation

## 2014-03-31 DIAGNOSIS — R5381 Other malaise: Secondary | ICD-10-CM | POA: Insufficient documentation

## 2014-03-31 DIAGNOSIS — F445 Conversion disorder with seizures or convulsions: Secondary | ICD-10-CM

## 2014-03-31 DIAGNOSIS — Z79899 Other long term (current) drug therapy: Secondary | ICD-10-CM | POA: Insufficient documentation

## 2014-03-31 DIAGNOSIS — R569 Unspecified convulsions: Secondary | ICD-10-CM | POA: Insufficient documentation

## 2014-03-31 DIAGNOSIS — R519 Headache, unspecified: Secondary | ICD-10-CM

## 2014-03-31 DIAGNOSIS — R51 Headache: Secondary | ICD-10-CM | POA: Insufficient documentation

## 2014-03-31 DIAGNOSIS — R5383 Other fatigue: Secondary | ICD-10-CM

## 2014-03-31 LAB — COMPREHENSIVE METABOLIC PANEL
ALT: 12 U/L (ref 0–35)
AST: 18 U/L (ref 0–37)
Albumin: 3.7 g/dL (ref 3.5–5.2)
Alkaline Phosphatase: 35 U/L — ABNORMAL LOW (ref 39–117)
Anion gap: 15 (ref 5–15)
BUN: 7 mg/dL (ref 6–23)
CALCIUM: 9.6 mg/dL (ref 8.4–10.5)
CHLORIDE: 100 meq/L (ref 96–112)
CO2: 23 meq/L (ref 19–32)
Creatinine, Ser: 0.69 mg/dL (ref 0.50–1.10)
GFR calc Af Amer: >90 mL/min (ref >90)
Glucose, Bld: 94 mg/dL (ref 70–99)
POTASSIUM: 3.8 meq/L (ref 3.7–5.3)
SODIUM: 138 meq/L (ref 137–147)
Total Bilirubin: 0.2 mg/dL — ABNORMAL LOW (ref 0.3–1.2)
Total Protein: 8 g/dL (ref 6.0–8.3)

## 2014-03-31 LAB — CBC WITH DIFFERENTIAL/PLATELET
BASOS ABS: 0.1 10*3/uL (ref 0.0–0.1)
Basophils Relative: 1 % (ref 0–1)
EOS PCT: 6 % — AB (ref 0–5)
Eosinophils Absolute: 0.3 10*3/uL (ref 0.0–0.7)
HCT: 39 % (ref 36.0–46.0)
Hemoglobin: 12.7 g/dL (ref 12.0–15.0)
LYMPHS PCT: 48 % — AB (ref 12–46)
Lymphs Abs: 2.4 10*3/uL (ref 0.7–4.0)
MCH: 26.8 pg (ref 26.0–34.0)
MCHC: 32.6 g/dL (ref 30.0–36.0)
MCV: 82.5 fL (ref 78.0–100.0)
Monocytes Absolute: 0.5 10*3/uL (ref 0.1–1.0)
Monocytes Relative: 10 % (ref 3–12)
NEUTROS ABS: 1.7 10*3/uL (ref 1.7–7.7)
NEUTROS PCT: 35 % — AB (ref 43–77)
PLATELETS: 185 10*3/uL (ref 150–400)
RBC: 4.73 MIL/uL (ref 3.87–5.11)
RDW: 14.2 % (ref 11.5–15.5)
WBC: 4.9 10*3/uL (ref 4.0–10.5)

## 2014-03-31 LAB — URINALYSIS, ROUTINE W REFLEX MICROSCOPIC
Bilirubin Urine: NEGATIVE
GLUCOSE, UA: NEGATIVE mg/dL
HGB URINE DIPSTICK: NEGATIVE
Ketones, ur: NEGATIVE mg/dL
Leukocytes, UA: NEGATIVE
Nitrite: NEGATIVE
PROTEIN: NEGATIVE mg/dL
Specific Gravity, Urine: 1.009 (ref 1.005–1.030)
Urobilinogen, UA: 0.2 mg/dL (ref 0.0–1.0)
pH: 6.5 (ref 5.0–8.0)

## 2014-03-31 LAB — PREGNANCY, URINE: PREG TEST UR: NEGATIVE

## 2014-03-31 MED ORDER — METOCLOPRAMIDE HCL 5 MG/ML IJ SOLN
10.0000 mg | Freq: Once | INTRAMUSCULAR | Status: AC
  Administered 2014-03-31: 10 mg via INTRAVENOUS
  Filled 2014-03-31: qty 2

## 2014-03-31 MED ORDER — SODIUM CHLORIDE 0.9 % IV BOLUS (SEPSIS)
1000.0000 mL | Freq: Once | INTRAVENOUS | Status: AC
  Administered 2014-03-31: 1000 mL via INTRAVENOUS

## 2014-03-31 MED ORDER — KETOROLAC TROMETHAMINE 30 MG/ML IJ SOLN
30.0000 mg | Freq: Once | INTRAMUSCULAR | Status: AC
  Administered 2014-03-31: 30 mg via INTRAVENOUS
  Filled 2014-03-31: qty 1

## 2014-03-31 MED ORDER — LORAZEPAM 2 MG/ML IJ SOLN
1.0000 mg | Freq: Once | INTRAMUSCULAR | Status: AC
  Administered 2014-03-31: 1 mg via INTRAVENOUS
  Filled 2014-03-31: qty 1

## 2014-03-31 NOTE — ED Notes (Signed)
MD at bedside. 

## 2014-03-31 NOTE — ED Notes (Signed)
Pt presents with c/o seizure-like activity that occurred 2 times today. 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 Pt has been having seizure-like activity for the past several months and has been diagnosed with pseudo seizures. Pt reports having symptoms of the lime disease that are getting worse, headache and fatigue. Alert and oriented at this time, NAD.

## 2014-03-31 NOTE — ED Provider Notes (Signed)
CSN: [id number]     Arrival date & time 03/31/14  1609 History   First MD Initiated Contact with Patient 03/31/14 1820     Chief Complaint  Patient presents with  . Seizures  . Weakness     (Consider location/radiation/quality/duration/timing/severity/associated sxs/prior Treatment) HPI Cheryl Novak is a 23 yof with pmhx of pseudo sz, HA who presents to the ER today after having "six pseudo sz" over the past 24 hrs. she also presents with a persistent headache, which she states has been present since May/2015. Patient states tonight she was diagnosed with Lyme disease in [place], and was seen and admitted at [facility].  Patient states while she was in [facility] she was also diagnosed with pseudoseizures. Patient states her headache is constant, and references to the top of her head for the location patient states it is also in her left frontal lobe. She states there are no alleviating or aggravating factors. She states she has tried taking gabapentin, hydrocodone, over-the-counter medications for her headache with no relief. She reports mild associated nausea, denies any associated dizziness, weakness, neck pain/stiffness, fever, shortness of breath, chest pain, abdominal pain, vomiting, diarrhea, dysuria.  Past Medical History  Diagnosis Date  . Headache(784.0)     occ migraines   Past Surgical History  Procedure Laterality Date  . Tonsillectomy      adenoidectomy  . Cholecystectomy  05/30/2012    Procedure: LAPAROSCOPIC CHOLECYSTECTOMY;  Surgeon: Cherylynn Ridges, MD;  Location: [facility] OR;  Service: General;  Laterality: N/A;   Family History  Problem Relation Age of Onset  . Cancer Maternal Aunt     breast  . Cancer Maternal Grandmother     breast   History  Substance Use Topics  . Smoking status: Never Smoker   . Smokeless tobacco: Never Used  . Alcohol Use: No   OB History   Grav Para Term Preterm Abortions TAB SAB Ect Mult Living                 Review of Systems   Constitutional: Negative for fever.  HENT: Negative for dental problem, hearing loss, sore throat, trouble swallowing and voice change.   Eyes: Positive for photophobia. Negative for visual disturbance.  Respiratory: Negative for shortness of breath.   Cardiovascular: Negative for chest pain.  Gastrointestinal: Negative for nausea, vomiting and abdominal pain.  Genitourinary: Negative for dysuria.  Musculoskeletal: Negative for neck pain.  Skin: Negative for rash.  Neurological: Positive for headaches. Negative for dizziness, weakness and numbness.      Allergies  Other  Home Medications   Prior to Admission medications   Medication Sig Start Date End Date Taking? Authorizing Provider  acetaminophen (TYLENOL) 325 MG tablet Take 650 mg by mouth every 6 (six) hours as needed for headache.   Yes Historical Provider, MD  aspirin-acetaminophen-caffeine (EXCEDRIN MIGRAINE) [phone] MG per tablet Take 3 tablets by mouth once as needed for headache.   Yes Historical Provider, MD  gabapentin (NEURONTIN) 100 MG capsule Take 100 mg by mouth 3 (three) times daily.   Yes Historical Provider, MD  Norethindrone-Ethinyl Estradiol-Fe (GENERESS FE) 0.8-25 MG-MCG tablet Chew 1 tablet by mouth daily.   Yes Historical Provider, MD   BP 130/71  Pulse 82  Temp(Src) 97.6 F (36.4 C) (Oral)  Resp 18  SpO2 100%  LMP 02/23/2014 Physical Exam  Nursing note and vitals reviewed. Constitutional: She is oriented to person, place, and time. She appears well-developed and well-nourished. No distress.  HENT:  Head: Normocephalic and atraumatic.  Mouth/Throat: Oropharynx is clear and moist. No oropharyngeal exudate.  Eyes: Conjunctivae and EOM are normal. Pupils are equal, round, and reactive to light. Right eye exhibits no discharge. Left eye exhibits no discharge. No scleral icterus.  Neck: Normal range of motion and full passive range of motion without pain. No rigidity. Normal range of motion present.   Cardiovascular: Normal rate, regular rhythm, S1 normal, S2 normal and normal heart sounds.  Exam reveals no friction rub.   No murmur heard. Pulses:      Radial pulses are 2+ on the right side, and 2+ on the left side.  Pulmonary/Chest: Effort normal and breath sounds normal. No accessory muscle usage. Not tachypneic. No respiratory distress.  Abdominal: Soft. Normal appearance and bowel sounds are normal. There is no tenderness. There is no rigidity, no guarding, no tenderness at McBurney's point and negative Murphy's sign.  Musculoskeletal: Normal range of motion. She exhibits no edema and no tenderness.  Lymphadenopathy:       Head (right side): No submental, no submandibular, no tonsillar, no preauricular, no posterior auricular and no occipital adenopathy present.       Head (left side): No submental, no submandibular, no tonsillar, no preauricular, no posterior auricular and no occipital adenopathy present.    She has no cervical adenopathy.  Neurological: She is alert and oriented to person, place, and time. She has normal strength. No cranial nerve deficit or sensory deficit. She displays a negative Romberg sign. Coordination normal.  Skin: Skin is warm and dry. No rash noted. She is not diaphoretic.  Psychiatric: She has a normal mood and affect.    ED Course  Procedures (including critical care time) Labs Review Labs Reviewed  CBC WITH DIFFERENTIAL - Abnormal; Notable for the following:    Neutrophils Relative % 35 (*)    Lymphocytes Relative 48 (*)    Eosinophils Relative 6 (*)    All other components within normal limits  COMPREHENSIVE METABOLIC PANEL - Abnormal; Notable for the following:    Alkaline Phosphatase 35 (*)    Total Bilirubin 0.2 (*)    All other components within normal limits  URINALYSIS, ROUTINE W REFLEX MICROSCOPIC  PREGNANCY, URINE    Imaging Review No results found.   EKG Interpretation None      MDM   Final diagnoses:  Headache in front of  head  Pseudoseizures    23 year old female with recent hx of Lyme's Disease in 11/2013 with tx at [facility] and pseudosz.  Here today s/p pseudosz x6 and HA since May.  W/u to r/o recurrent infection, treatment for HA.  Neuro exam benign.     9:00 PM: Patient's labs unremarkable, we will discharge at this time. We strongly encouraged patient to followup with neurology department at [facility] as scheduled to do so next Tuesday for her pseudoseizures.Pt HA treated and improved while in ED.  Presentation is like pts typical HA and non concerning for [facility], ICH, Meningitis, or temporal arteritis. Pt is afebrile with no focal neuro deficits, nuchal rigidity, or change in vision. Pt is to follow up with PCP to discuss prophylactic medication. Pt verbalizes understanding and is agreeable with plan to dc. We encouraged patient to call or return to the ER should she have any questions or concerns or should her symptoms return, or worsen.  Filed Vitals:   03/31/14 2035  BP: 130/71  Pulse: 82  Temp: 97.6 F (36.4 C)  Resp: 18    Signed,  Ladona Mow, PA-C 2:13 AM   This patient seen and discussed with Dr. Chaney Malling, M.D.    Monte Fantasia, PA-C 04/01/14 [id number]

## 2014-03-31 NOTE — ED Notes (Signed)
Pt and visitors made aware of delay.  Visitor sts Pt has had two more "seizures" and the 2nd one last "around ."

## 2014-03-31 NOTE — Discharge Instructions (Signed)
Follow up with [facility] as scheduled by appointment.  Return to the ER as needed.    Nonepileptic Seizures Nonepileptic seizures are seizures that are not caused by abnormal electrical signals in your brain. These seizures often seem like epileptic seizures, but they are not caused by epilepsy.  There are two types of nonepileptic seizures:  A physiologic nonepileptic seizure results from a disruption in your brain.  A psychogenic seizure results from emotional stress. These seizures are sometimes called pseudoseizures. CAUSES  Causes of physiologic nonepileptic seizures include:   Sudden drop in blood pressure.  Low blood sugar.  Low levels of salt (sodium) in your blood.  Low levels of calcium in your blood.  Migraine.  Heart rhythm problems.  Sleep disorders.  Drug and alcohol abuse. Common causes of psychogenic nonepileptic seizures include:  Stress.  Emotional trauma.  Sexual or physical abuse.  Major life events, such as divorce or the death of a loved one.  Mental health disorders, including panic attack and hyperactivity disorder. SIGNS AND SYMPTOMS A nonepileptic seizure can look like an epileptic seizure, including uncontrollable shaking (convulsions), or changes in attention, behavior, or the ability to remain awake and alert. However, there are some differences. Nonepileptic seizures usually:  Do not cause physical injuries.  Start slowly.  Include crying or shrieking.  Last longer than 2 minutes.  Have a short recovery time without headache or exhaustion. DIAGNOSIS  Your health care provider can usually diagnose nonepileptic seizures after taking your medical history and giving you a physical exam. Your health care provider may want to talk to your friends or relatives who have seen you have a seizure.  You may also need to have tests to look for causes of physiologic nonepileptic seizures. This may include an electroencephalogram  (EEG), which is a test that measures electrical activity in your brain. If you have had an epileptic seizure, the results of your EEG will be abnormal. If your health care provider thinks you have had a psychogenic nonepileptic seizure, you may need to see a mental health specialist for an evaluation. TREATMENT  Treatment depends on the type and cause of your seizures.  For physiologic nonepileptic seizures, treatment is aimed at addressing the underlying condition that caused the seizures. These seizures usually stop when the underlying condition is properly treated.  Nonepileptic seizures do not respond to the seizure medicines used to treat epilepsy.  For psychogenic seizures, you may need to work with a mental health specialist. HOME CARE INSTRUCTIONS Home care will depend on the type of nonepileptic seizures you have.   Follow all your health care provider's instructions.  Keep all your follow-up appointments. SEEK MEDICAL CARE IF: You continue to have seizures after treatment. SEEK IMMEDIATE MEDICAL CARE IF:  Your seizures change or become more frequent.  You injure yourself during a seizure.  You have one seizure after another.  You have trouble recovering from a seizure.  You have chest pain or trouble breathing. MAKE SURE YOU:  Understand these instructions.  Will watch your condition.  Will get help right away if you are not doing well or get worse. Document Released: 08/31/2005 Document Revised: 11/30/2013 Document Reviewed: 05/12/2013 [facility] Patient Information 2015 [place], [place]. This information is not intended to replace advice given to you by your health care provider. Make sure you discuss any questions you have with your health care provider.  General Headache Without Cause A headache is pain or discomfort felt around the head or neck  area. The specific cause of a headache may not be found. There are many causes and types of headaches. A few common ones  are:  Tension headaches.  Migraine headaches.  Cluster headaches.  Chronic daily headaches. HOME CARE INSTRUCTIONS   Keep all follow-up appointments with your caregiver or any specialist referral.  Only take over-the-counter or prescription medicines for pain or discomfort as directed by your caregiver.  Lie down in a dark, quiet room when you have a headache.  Keep a headache journal to find out what may trigger your migraine headaches. For example, write down:  What you eat and drink.  How much sleep you get.  Any change to your diet or medicines.  Try massage or other relaxation techniques.  Put ice packs or heat on the head and neck. Use these 3 to 4 times per day for 15 to 20 minutes each time, or as needed.  Limit stress.  Sit up straight, and do not tense your muscles.  Quit smoking if you smoke.  Limit alcohol use.  Decrease the amount of caffeine you drink, or stop drinking caffeine.  Eat and sleep on a regular schedule.  Get 7 to 9 hours of sleep, or as recommended by your caregiver.  Keep lights dim if bright lights bother you and make your headaches worse. SEEK MEDICAL CARE IF:   You have problems with the medicines you were prescribed.  Your medicines are not working.  You have a change from the usual headache.  You have nausea or vomiting. SEEK IMMEDIATE MEDICAL CARE IF:   Your headache becomes severe.  You have a fever.  You have a stiff neck.  You have loss of vision.  You have muscular weakness or loss of muscle control.  You start losing your balance or have trouble walking.  You feel faint or pass out.  You have severe symptoms that are different from your first symptoms. MAKE SURE YOU:   Understand these instructions.  Will watch your condition.  Will get help right away if you are not doing well or get worse. Document Released: 07/16/2005 Document Revised: 10/08/2011 Document Reviewed: 08/01/2011 [facility] Patient  Information 2015 [place], [place]. This information is not intended to replace advice given to you by your health care provider. Make sure you discuss any questions you have with your health care provider.

## 2014-03-31 NOTE — ED Notes (Signed)
Bed: WA19 Expected date:  Expected time:  Means of arrival:  Comments: Triage 2 

## 2014-03-31 NOTE — ED Notes (Signed)
PA-C at bedside 

## 2014-04-01 NOTE — ED Provider Notes (Signed)
Medical screening examination/treatment/procedure(s) were conducted as a shared visit with non-physician practitioner(s) and myself.  I personally evaluated the patient during the encounter.   EKG Interpretation None      Cheryl Novak is a 23 y.o. 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 She describes them as some hand shaking episodes. Denies tonic clonic seizures. Also has been having headache for over a month and has been taking gabapentin. Recently moved here, suppose to see neuro at [facility] next week, but has more symptoms so came here for eval. On exam, no obvious tremors or seizure activity. Neuro exam unremarkable. Appears slightly anxious. I counseled her and parents about seizures vs pseudoseizures. Her labs unremarkable. Felt better with migraine cocktail and ativan. I told them that they should follow up with neuro at [facility].    Richardean Canal, MD 04/01/14 2139
